# Patient Record
Sex: Female | Born: 1935 | Race: White | Hispanic: No | Smoking: Never smoker
Health system: Southern US, Community
[De-identification: ages and names within clinical notes are randomized; demographics above are authoritative.]

## PROBLEM LIST (undated history)

## (undated) DIAGNOSIS — E785 Hyperlipidemia, unspecified: Secondary | ICD-10-CM

## (undated) DIAGNOSIS — K5792 Diverticulitis of intestine, part unspecified, without perforation or abscess without bleeding: Secondary | ICD-10-CM

## (undated) DIAGNOSIS — M1990 Unspecified osteoarthritis, unspecified site: Secondary | ICD-10-CM

## (undated) DIAGNOSIS — N390 Urinary tract infection, site not specified: Secondary | ICD-10-CM

## (undated) DIAGNOSIS — R42 Dizziness and giddiness: Secondary | ICD-10-CM

## (undated) HISTORY — DX: Hyperlipidemia, unspecified: E78.5

## (undated) HISTORY — PX: ABDOMINAL HYSTERECTOMY: SHX81

## (undated) HISTORY — DX: Diverticulitis of intestine, part unspecified, without perforation or abscess without bleeding: K57.92

## (undated) HISTORY — DX: Unspecified osteoarthritis, unspecified site: M19.90

---
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
## 2012-03-16 ENCOUNTER — Ambulatory Visit (INDEPENDENT_AMBULATORY_CARE_PROVIDER_SITE_OTHER): Payer: Medicare Other | Admitting: Family Medicine

## 2012-03-16 ENCOUNTER — Encounter: Payer: Self-pay | Admitting: Family Medicine

## 2012-03-16 VITALS — BP 118/72 | HR 62 | Temp 97.5°F | Ht 64.0 in | Wt 161.8 lb

## 2012-03-16 DIAGNOSIS — E785 Hyperlipidemia, unspecified: Secondary | ICD-10-CM

## 2012-03-16 DIAGNOSIS — I10 Essential (primary) hypertension: Secondary | ICD-10-CM

## 2012-03-16 DIAGNOSIS — M1990 Unspecified osteoarthritis, unspecified site: Secondary | ICD-10-CM

## 2012-03-16 DIAGNOSIS — L89321 Pressure ulcer of left buttock, stage 1: Secondary | ICD-10-CM

## 2012-03-16 DIAGNOSIS — K219 Gastro-esophageal reflux disease without esophagitis: Secondary | ICD-10-CM | POA: Insufficient documentation

## 2012-03-16 DIAGNOSIS — M109 Gout, unspecified: Secondary | ICD-10-CM | POA: Insufficient documentation

## 2012-03-16 DIAGNOSIS — E079 Disorder of thyroid, unspecified: Secondary | ICD-10-CM

## 2012-03-16 LAB — LIPID PANEL
HDL: 50.2 mg/dL (ref >39.00)
Triglycerides: 200 mg/dL — ABNORMAL HIGH (ref 0.0–149.0)
VLDL: 40 mg/dL (ref 0.0–40.0)

## 2012-03-16 LAB — BASIC METABOLIC PANEL
BUN: 28 mg/dL — ABNORMAL HIGH (ref 6–23)
Chloride: 104 mEq/L (ref 96–112)
Glucose, Bld: 88 mg/dL (ref 70–99)
Potassium: 4.2 mEq/L (ref 3.5–5.1)
Sodium: 141 mEq/L (ref 135–145)

## 2012-03-16 LAB — HEPATIC FUNCTION PANEL
Bilirubin, Direct: 0.1 mg/dL (ref 0.0–0.3)
Total Bilirubin: 0.3 mg/dL (ref 0.3–1.2)

## 2012-03-16 LAB — TSH: TSH: 0.47 u[IU]/mL (ref 0.35–5.50)

## 2012-03-16 MED ORDER — ALLOPURINOL 100 MG PO TABS: 100.0000 mg | ORAL_TABLET | Freq: Every day | ORAL | Status: AC

## 2012-03-16 MED ORDER — CEPHALEXIN 500 MG PO CAPS: 500.0000 mg | ORAL_CAPSULE | Freq: Two times a day (BID) | ORAL | Status: AC

## 2012-03-16 MED ORDER — MICONAZOLE NITRATE 2 % EX CREA: TOPICAL_CREAM | Freq: Two times a day (BID) | CUTANEOUS | Status: AC

## 2012-03-16 MED ORDER — TRAMADOL HCL 50 MG PO TABS: 50.0000 mg | ORAL_TABLET | Freq: Three times a day (TID) | ORAL | Status: AC | PRN

## 2012-03-16 NOTE — Patient Instructions (Addendum)
Follow up in 6 months We'll notify you of your lab results and make any changes if needed Apply the Miconazole cream twice daily to buttock Start the Keflex twice daily for skin infection Start the Tramadol as needed for pain Someone will call you about home health evaluation Call with any questions or concerns Welcome and Happy Early [place] Ouch!

## 2012-03-16 NOTE — Progress Notes (Signed)
  Subjective:    Patient ID: Olivia Weaver, female    DOB: September 09, 1918, 78 y.o.   MRN: [id number]  HPI New to establish.  Recently moved in w/ daughter.  HTN- chronic problem, on Monopril and Toprol.  No CP, SOB, visual changes, edema.  + HAs.  Hyperlipidemia- chronic problem, has been maintained on Crestor samples.  Last blood work was ~6 months ago.  Not exercising due to mobility issues.  Gout- chronic problem, on Allopurinol daily, last flare was 'years ago'.  Needs refill on meds.  GERD- chronic problem, on Prilosec OTC daily  Osteoarthritis- bilateral knees, ambulates w/ walker.  Pt has pain daily  Compression fx- per daughter's report.  L buttock sore- started 1 week ago, appeared blister like.  Daughter treated w/ neosporin and reports 'it went away'.  Now w/ redness below the skin and the redness is 'travelling'.  Thyroid mass- doctor in [facility] offered tx/bx, pt refused.   Review of Systems For ROS see HPI     Objective:   Physical Exam  Vitals reviewed. Constitutional: She is oriented to person, place, and time. She appears well-developed and well-nourished. No distress.       Frail, elderly lady in wheel chair  HENT:  Head: Normocephalic and atraumatic.  Eyes: Conjunctivae and EOM are normal. Pupils are equal, round, and reactive to light.  Neck: Normal range of motion. Neck supple. No thyromegaly present.  Cardiovascular: Normal rate, regular rhythm, normal heart sounds and intact distal pulses.   No murmur heard. Pulmonary/Chest: Effort normal and breath sounds normal. No respiratory distress.  Abdominal: Soft. She exhibits no distension. There is no tenderness.  Musculoskeletal: She exhibits no edema.  Lymphadenopathy:    She has no cervical adenopathy.  Neurological: She is alert and oriented to person, place, and time.  Skin: Skin is warm and dry. There is erythema (small circular area of erythema over L ischium- blanches easily).  Psychiatric: She has a normal  mood and affect. Her behavior is normal.          Assessment & Plan:

## 2012-03-17 NOTE — Assessment & Plan Note (Signed)
Chronic problem.  Well controlled.  Asymptomatic.  No changes. 

## 2012-03-17 NOTE — Assessment & Plan Note (Signed)
New to provider, chronic for pt.  Severely limiting ambulation.  Handicapped form completed for pt's daughter.  Will start Ultram for pain relief.

## 2012-03-17 NOTE — Assessment & Plan Note (Signed)
New to provider.  Chronic for pt.  Maintaining on samples.  Check labs.  Adjust meds prn.

## 2012-03-17 NOTE — Assessment & Plan Note (Signed)
New to provider.  Chronic for pt.  Has not had recent flare.  Hesitant to d/c allopurinol for fear of precipitating a flare.  Will continue at this time.

## 2012-03-17 NOTE — Assessment & Plan Note (Signed)
New.  Appears to be pressure related.  Start fungal cream as pt wears depends and this will provide barrier.  Start Keflex for possible cellulitis.  Reviewed w/ daughter proper positioning techniques, including changing position every 2-4 hrs.  Will follow.

## 2012-03-17 NOTE — Assessment & Plan Note (Signed)
New to provider, chronic problem for pt.  sxs well controlled on omeprazole.  No changes.

## 2012-03-20 ENCOUNTER — Other Ambulatory Visit: Payer: Self-pay | Admitting: *Deleted

## 2012-03-20 NOTE — Telephone Encounter (Signed)
Called pt and spoke with daughter per incoming notice from [facility] stating they are unable to fill her recent rx request per they do not have information on this pt, daughter advised that she gave our office the wrong pharmacy information however this has been corrected and the medication is currently on the way to her home at this time pt daughter Gene Schroll apologized for telling us the wrong pharmacy information, she also asked about the home health referral for this pt, advised that I will fax this information to [facility] and that someone will call her about coming out to her home to assess, pt daughter understood, pt information and completed form to [facility]

## 2012-03-27 ENCOUNTER — Telehealth: Payer: Self-pay | Admitting: *Deleted

## 2012-03-27 ENCOUNTER — Emergency Department (HOSPITAL_BASED_OUTPATIENT_CLINIC_OR_DEPARTMENT_OTHER)
Admission: EM | Admit: 2012-03-27 | Discharge: 2012-03-27 | Disposition: A | Discharge status: Home / Self Care | Payer: Medicare Other | Attending: Emergency Medicine | Admitting: Emergency Medicine

## 2012-03-27 ENCOUNTER — Encounter (HOSPITAL_BASED_OUTPATIENT_CLINIC_OR_DEPARTMENT_OTHER): Payer: Self-pay | Admitting: Emergency Medicine

## 2012-03-27 ENCOUNTER — Emergency Department (INDEPENDENT_AMBULATORY_CARE_PROVIDER_SITE_OTHER): Payer: Medicare Other

## 2012-03-27 DIAGNOSIS — E860 Dehydration: Secondary | ICD-10-CM

## 2012-03-27 DIAGNOSIS — R51 Headache: Secondary | ICD-10-CM

## 2012-03-27 DIAGNOSIS — R4182 Altered mental status, unspecified: Secondary | ICD-10-CM | POA: Insufficient documentation

## 2012-03-27 DIAGNOSIS — E785 Hyperlipidemia, unspecified: Secondary | ICD-10-CM | POA: Insufficient documentation

## 2012-03-27 DIAGNOSIS — Z742 Need for assistance at home and no other household member able to render care: Secondary | ICD-10-CM

## 2012-03-27 HISTORY — DX: Urinary tract infection, site not specified: N39.0

## 2012-03-27 HISTORY — DX: Dizziness and giddiness: R42

## 2012-03-27 LAB — DIFFERENTIAL
Basophils Absolute: 0 10*3/uL (ref 0.0–0.1)
Basophils Relative: 0 % (ref 0–1)
Eosinophils Absolute: 0.1 10*3/uL (ref 0.0–0.7)
Eosinophils Relative: 1 % (ref 0–5)
Lymphocytes Relative: 25 % (ref 12–46)
Lymphs Abs: 1.9 10*3/uL (ref 0.7–4.0)
Monocytes Absolute: 0.7 K/uL (ref 0.1–1.0)
Monocytes Relative: 9 % (ref 3–12)
Neutro Abs: 4.7 K/uL (ref 1.7–7.7)
Neutrophils Relative %: 64 % (ref 43–77)

## 2012-03-27 LAB — CBC
HCT: 35.7 % — ABNORMAL LOW (ref 36.0–46.0)
Hemoglobin: 12.1 g/dL (ref 12.0–15.0)
MCH: 33 pg (ref 26.0–34.0)
MCHC: 33.9 g/dL (ref 30.0–36.0)
MCV: 97.3 fL (ref 78.0–100.0)
Platelets: 148 10*3/uL — ABNORMAL LOW (ref 150–400)
RBC: 3.67 MIL/uL — ABNORMAL LOW (ref 3.87–5.11)
RDW: 12.9 % (ref 11.5–15.5)
WBC: 7.3 10*3/uL (ref 4.0–10.5)

## 2012-03-27 LAB — URINALYSIS, ROUTINE W REFLEX MICROSCOPIC
Bilirubin Urine: NEGATIVE
Glucose, UA: NEGATIVE mg/dL
Hgb urine dipstick: NEGATIVE
Ketones, ur: NEGATIVE mg/dL
Leukocytes, UA: NEGATIVE
Nitrite: NEGATIVE
Protein, ur: NEGATIVE mg/dL
Specific Gravity, Urine: 1.012 (ref 1.005–1.030)
Urobilinogen, UA: 0.2 mg/dL (ref 0.0–1.0)
pH: 6.5 (ref 5.0–8.0)

## 2012-03-27 LAB — BASIC METABOLIC PANEL WITH GFR
BUN: 42 mg/dL — ABNORMAL HIGH (ref 6–23)
CO2: 28 meq/L (ref 19–32)
Calcium: 10 mg/dL (ref 8.4–10.5)
Chloride: 103 meq/L (ref 96–112)
Creatinine, Ser: 1.3 mg/dL — ABNORMAL HIGH (ref 0.50–1.10)
GFR calc Af Amer: 40 mL/min — ABNORMAL LOW
GFR calc non Af Amer: 35 mL/min — ABNORMAL LOW
Glucose, Bld: 94 mg/dL (ref 70–99)
Potassium: 5 meq/L (ref 3.5–5.1)
Sodium: 139 meq/L (ref 135–145)

## 2012-03-27 LAB — SEDIMENTATION RATE: Sed Rate: 59 mm/hr — ABNORMAL HIGH (ref 0–22)

## 2012-03-27 MED ORDER — SODIUM CHLORIDE 0.9 % IV BOLUS (SEPSIS)
500.0000 mL | Freq: Once | INTRAVENOUS | Status: AC
  Administered 2012-03-27: 500 mL via INTRAVENOUS

## 2012-03-27 MED ORDER — DIPHENHYDRAMINE HCL 50 MG/ML IJ SOLN
12.5000 mg | Freq: Once | INTRAMUSCULAR | Status: AC
  Administered 2012-03-27: 50 mg via INTRAVENOUS
  Filled 2012-03-27: qty 1

## 2012-03-27 MED ORDER — METOCLOPRAMIDE HCL 5 MG/ML IJ SOLN
5.0000 mg | Freq: Once | INTRAMUSCULAR | Status: AC
  Administered 2012-03-27: 10 mg via INTRAVENOUS
  Filled 2012-03-27: qty 2

## 2012-03-27 MED ORDER — SODIUM CHLORIDE 0.9 % IV BOLUS (SEPSIS)
500.0000 mL | Freq: Once | INTRAVENOUS | Status: AC
  Administered 2012-03-27: 1000 mL via INTRAVENOUS

## 2012-03-27 MED ORDER — FENTANYL CITRATE 0.05 MG/ML IJ SOLN
12.5000 ug | Freq: Once | INTRAMUSCULAR | Status: AC
  Administered 2012-03-27: 10:00:00 via INTRAVENOUS
  Filled 2012-03-27: qty 2

## 2012-03-27 NOTE — ED Provider Notes (Addendum)
History     CSN: [id number]  Arrival date & time 03/27/12  7829   First MD Initiated Contact with Patient 03/27/12 [phone]      Chief Complaint  Patient presents with  . Headache  . Altered Mental Status    (Consider location/radiation/quality/duration/timing/severity/associated sxs/prior treatment) HPI Comments: Pt was in usual state of health last night, lives with daughter, developed gradual frontal HA last night, that waxed and waned.  Not worse with movement, wasn't able to get comfortable last night, got up early this AM, told family about it around 0800 and wanted to come to the ED.  She seemed mildly more confused than usual, family thought possibly UTI which she has had confusion in the past with same.  Pt denies N/V, no fevers, chills, ate dinner well last night, no slurred speech, balance or spinning sensation is ok now.  She reports HA pain is improved on its own.  She gets N/V often with even OTC meds and has ultram at home, but pt never takes it.  No numbness or weakness in limbs.  Pt denies visual changes or loss.    The history is provided by the patient and a relative.    Past Medical History  Diagnosis Date  . Arthritis   . Diverticulitis   . Hyperlipidemia   . Gout   . Vertigo   . Urinary tract infection     Past Surgical History  Procedure Date  . Appendectomy 1933  . Abdominal hysterectomy     Family History  Problem Relation Age of Onset  . Arthritis Mother     History  Substance Use Topics  . Smoking status: Never Smoker   . Smokeless tobacco: Not on file  . Alcohol Use: Yes     shot of segrams and a beer every night    OB History    Grav Para Term Preterm Abortions TAB SAB Ect Mult Living                  Review of Systems  Constitutional: Negative for fever and chills.  HENT: Negative for congestion, sore throat, rhinorrhea, sneezing, neck pain and neck stiffness.   Eyes: Negative for visual disturbance.  Respiratory: Negative for  cough and shortness of breath.   Cardiovascular: Negative for chest pain.  Gastrointestinal: Negative for nausea and vomiting.  Genitourinary: Negative for dysuria.  Musculoskeletal: Positive for back pain and arthralgias.  Skin: Negative for color change and rash.  Neurological: Positive for headaches. Negative for dizziness, syncope, speech difficulty and weakness.  Psychiatric/Behavioral: Positive for confusion.    Allergies  Tylenol  Home Medications   Current Outpatient Rx  Name Route Sig Dispense Refill  . ALLOPURINOL 100 MG PO TABS Oral Take 1 tablet (100 mg total) by mouth daily. 90 tablet 3  . ALLOPURINOL 100 MG PO TABS Oral Take 1 tablet (100 mg total) by mouth daily. 90 tablet 3  . ASPIRIN 81 MG PO TABS Oral Take 81 mg by mouth daily.    . CEPHALEXIN 500 MG PO CAPS Oral Take 1 capsule (500 mg total) by mouth 2 (two) times daily. 20 capsule 0  . VITAMIN D 1000 UNITS PO TABS Oral Take 2,000 Units by mouth daily.    . FOSINOPRIL SODIUM 40 MG PO TABS Oral Take 40 mg by mouth daily.    Marland Kitchen METOPROLOL SUCCINATE ER 50 MG PO TB24 Oral Take 50 mg by mouth daily. Take with or immediately following a meal.    .  MICONAZOLE NITRATE 2 % EX CREA Topical Apply topically 2 (two) times daily. 30 g 1  . OMEPRAZOLE MAGNESIUM 20 MG PO TBEC Oral Take 20 mg by mouth daily.    Marland Kitchen ROSUVASTATIN CALCIUM 5 MG PO TABS Oral Take 5 mg by mouth daily.    . TRAMADOL HCL 50 MG PO TABS Oral Take 1 tablet (50 mg total) by mouth every 8 (eight) hours as needed for pain. 45 tablet 1    BP 127/68  Pulse 79  Temp(Src) 98.1 F (36.7 C) (Oral)  Resp 16  Ht 5\' 3"  (1.6 m)  Wt 161 lb (73.029 kg)  BMI 28.52 kg/m2  SpO2 100%  Physical Exam  Nursing note and vitals reviewed. Constitutional: She appears well-developed and well-nourished.  HENT:  Head: Normocephalic and atraumatic.       No tenderness at temporal regions  Eyes: Conjunctivae and EOM are normal. Pupils are equal, round, and reactive to light.  Right eye exhibits no chemosis. Left eye exhibits no chemosis. Right conjunctiva is not injected. Left conjunctiva is not injected.  Neck: Normal range of motion. Neck supple.  Cardiovascular: Normal rate.   Pulmonary/Chest: Effort normal. No respiratory distress.  Abdominal: Soft. She exhibits no distension. There is no tenderness.  Neurological: She is alert. GCS eye subscore is 4. GCS verbal subscore is 4. GCS motor subscore is 6.  Skin: Skin is warm and intact. No rash noted. She is not diaphoretic. No pallor.    ED Course  Procedures (including critical care time)  Labs Reviewed  CBC - Abnormal; Notable for the following:    RBC 3.67 (*)    HCT 35.7 (*)    Platelets 148 (*)    All other components within normal limits  BASIC METABOLIC PANEL - Abnormal; Notable for the following:    BUN 42 (*)    Creatinine, Ser 1.30 (*)    GFR calc non Af Amer 35 (*)    GFR calc Af Amer 40 (*)    All other components within normal limits  SEDIMENTATION RATE - Abnormal; Notable for the following:    Sed Rate 59 (*)    All other components within normal limits  URINALYSIS, ROUTINE W REFLEX MICROSCOPIC  DIFFERENTIAL   Ct Head Wo Contrast  03/27/2012  *RADIOLOGY REPORT*  Clinical Data: Headache  CT HEAD WITHOUT CONTRAST  Technique:  Contiguous axial images were obtained from the base of the skull through the vertex without contrast.  Comparison: None.  Findings: Chronic ischemic changes.  Atrophy.  No mass effect, midline shift, or acute intracranial hemorrhage.  Mastoid air cells and visualized paranasal sinuses are clear.  Cranium is intact.  IMPRESSION: No acute intracranial pathology.  Original Report Authenticated By: Donavan Burnet, M.D.     1. Headache   2. Dehydration     11:06 AM WBC and Hgb are near normal.  Head CT per radiologist reports no acute. I reviewed the images myself.  Pt is feeling a little improved.  BMP results shows elevated BUN and slightly to Cr, suggesting mild  dehydration.  IVF bolus given.    12:37 PM Pt feels improved, IVF's given.  Sed rate is mildly elevated, for her age a sed rate of 40 is likely upper limit of normal, but I doubt between the mild elevation and location of symptoms with no temporal tenderness, no visual change, normal eye exam makes TA unlikely.     MDM  Pt is not toxic appearing, no fever, no  gross focal neuro deficits here, no facial droop, slurred speech m,moves 4 extremities with purpose symmetrically.  No pronator drift noted.  Given age, will get sed rate, check basic labs and head CT.  Since HA is improved, doubt bleed or infectious etiology.  No fever.  No meningismus.  Will try reglan and benadryl and low dose fentanyl for pain.          Gavin Pound. Oletta Lamas, MD 03/27/12 1238  Gavin Pound. Skylier Kretschmer, MD 03/27/12 1239

## 2012-03-27 NOTE — Telephone Encounter (Signed)
Spoke to pt daughter and advised that I will call the Home Health agency to see what the update is, pt daughter understood and wanted to let MD Beverely Low be aware that the pt did well with hydrating for 2 days and then ended up in the [facility] today per dehydration and was given IV fluids with more instructions to start hydrate again, pt is feeling better now, I advised that I will also alert MD Tabori about pt recent developments, this nurse re-faxed pt home care app and information needed.

## 2012-03-27 NOTE — Telephone Encounter (Signed)
Pt daughter calling to check the status of the home health referral that was suppose to be coming out to the home.Please advise

## 2012-03-27 NOTE — ED Notes (Signed)
Daughter states her mother woke her up to c/o headache that had been going on all night.  Daughter states she is having some episodes of confusion and not making sense.  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  Difficult in communicating with patient.

## 2012-03-27 NOTE — Discharge Instructions (Signed)
Dehydration, Elderly  Dehydration is when you lose more fluids from the body than you take in. Vital organs such as the kidneys, brain, and heart cannot function without a proper amount of fluids and salt. Any loss of fluids from the body can cause dehydration.   Older adults are at a higher risk of dehydration than younger adults. As we age, our bodies are less able to conserve water and do not respond to temperature changes as well. Also, older adults do not become thirsty as easily or quickly. Because of this, older adults often do not realize they need to increase fluids to avoid dehydration.   CAUSES    Vomiting.   Diarrhea.   Excessive sweating.   Excessive urine output.   Fever.  SYMPTOMS   Mild dehydration   Thirst.   Dry lips.   Slightly dry mouth.  Moderate dehydration   Very dry mouth.   Sunken eyes.   Skin does not bounce back quickly when lightly pinched and released.   Dark urine and decreased urine production.   Decreased tear production.   Headache.  Severe dehydration   Very dry mouth.   Extreme thirst.   Rapid, weak pulse (more than 100 beats per minute at rest).   Cold hands and feet.   Not able to sweat in spite of heat.   Rapid breathing.   Blue lips.   Confusion and lethargy.   Difficulty being awakened.   Minimal urine production.   No tears.  DIAGNOSIS   Your caregiver will diagnose dehydration based on your symptoms and your exam. Blood and urine tests will help confirm the diagnosis. The diagnostic evaluation should also identify the cause of dehydration.  TREATMENT   Treatment of mild or moderate dehydration can often be done at home by increasing the amount of fluids that you drink. It is best to drink small amounts of fluid more often. Drinking too much at one time can make vomiting worse.   Severe dehydration needs to be treated at the hospital where you will probably be given intravenous (IV) fluids that contain water and electrolytes.  HOME CARE INSTRUCTIONS     Ask your caregiver about specific rehydration instructions.   Drink enough fluids to keep your urine clear or pale yellow.   Drink small amounts frequently if you have nausea and vomiting.   Eat as you normally do.   Avoid:   Foods or drinks high in sugar.   Carbonated drinks.   Juice.   Extremely hot or cold fluids.   Drinks with caffeine.   Fatty, greasy foods.   Alcohol.   Tobacco.   Overeating.   Gelatin desserts.   Wash your hands well to avoid spreading bacteria and viruses.   Only take over-the-counter or prescription medicines for pain, discomfort, or fever as directed by your caregiver.   Ask your caregiver if you should continue all prescribed and over-the-counter medicines.   Keep all follow-up appointments with your caregiver.  SEEK MEDICAL CARE IF:   You have abdominal pain and it increases or stays in one area (localizes).   You have a rash, stiff neck, or severe headache.   You are irritable, sleepy, or difficult to awaken.   You are weak, dizzy, or extremely thirsty.  SEEK IMMEDIATE MEDICAL CARE IF:    You are unable to keep fluids down, or you get worse despite treatment.   You have frequent episodes of vomiting or diarrhea.   You have blood or green   matter (bile) in your vomit.   You have blood in your stool or your stool looks black and tarry.   You have not urinated in 6 to 8 hours, or you have only urinated a small amount of very dark urine.   You have a fever.   You faint.  MAKE SURE YOU:    Understand these instructions.   Will watch your condition.   Will get help right away if you are not doing well or get worse.  Document Released: 01/25/2004 Document Revised: 10/24/2011 Document Reviewed: 06/24/2011  Exit[employer] Patient Information 2012 [employer].

## 2012-03-27 NOTE — ED Notes (Signed)
MD at bedside. 

## 2012-03-29 NOTE — Telephone Encounter (Signed)
Noted- pt needs to continue oral hydration and needs home health referral.  May need to discuss hospice care if pt unable to stay hydrated (just for consultation)

## 2012-03-30 NOTE — Telephone Encounter (Signed)
Pt daughter advised that [facility] came out to review yesterday and advised they will

## 2012-03-30 NOTE — Telephone Encounter (Signed)
Pt family member noted that she is pushing the hydration concern to the pt, also placed the referral for home health in the chart, MD Tabori made aware verbally

## 2012-03-30 NOTE — Telephone Encounter (Signed)
Spoke to pt daughter and she advised that [facility] rep stephanie came out yesterday and advised pt the importance of fluid intake, pt understood and noted intake increase today of a cup of coffee, cup of orange juice and 2 six ounce cups of tea, Judeth Cornfield advised that the needs for the pt are in the works and that MD Beverely Low will be advised about pt needs asap.she also advised daughter about hospice assessment per fluid intake, pt daughter advised that she wants to wait for a week to see if she is going to continue improving with her intake, this nurse advised if she has any questions or concerns to please call the office and if she notes and sxs worsening per lack of fluid intake to please take the pt to the ED as she did recently, pt daughter advised that she will do that especially since the [address] ED was so wonderful to her mother.

## 2012-03-30 NOTE — Telephone Encounter (Signed)
Called pt daughter to advise instructions about hospice if the pt still refusing to hydrate, left message on machine to call office to discuss

## 2012-04-07 ENCOUNTER — Telehealth: Payer: Self-pay | Admitting: *Deleted

## 2012-04-07 NOTE — Telephone Encounter (Signed)
Call transfer from [facility]: [facility] from [facility] called stating that Pt had a episode of emesis that looked like bile on last night. Nurse notes that Pt has not vomited since but does have daily episodes of vomiting. Nurse denied any other symptoms. Pt is also c/o a lot of gaging which initiate the vomiting. Per Nurse the Prilosec used to help with these symptoms but has since not been successful in resolving issue. Physical therapy consult is also being requested. Per Dr Beverely Low Pt needs to be seen in office but if Pt has another episode of bile vomit Pt needs to be seen in ED immediately. Nurse and Pt daughter both OK info, appt scheduled for tomorrow and verbal Ok given for therapy.

## 2012-04-08 ENCOUNTER — Ambulatory Visit (HOSPITAL_BASED_OUTPATIENT_CLINIC_OR_DEPARTMENT_OTHER)
Admission: RE | Admit: 2012-04-08 | Discharge: 2012-04-08 | Disposition: A | Discharge status: Home / Self Care | Payer: Medicare Other | Source: Ambulatory Visit | Attending: Family Medicine | Admitting: Family Medicine

## 2012-04-08 ENCOUNTER — Ambulatory Visit (INDEPENDENT_AMBULATORY_CARE_PROVIDER_SITE_OTHER): Payer: Medicare Other | Admitting: Family Medicine

## 2012-04-08 VITALS — BP 121/77 | HR 86 | Temp 98.3°F | Ht 64.0 in | Wt 160.0 lb

## 2012-04-08 DIAGNOSIS — R1312 Dysphagia, oropharyngeal phase: Secondary | ICD-10-CM

## 2012-04-08 DIAGNOSIS — K219 Gastro-esophageal reflux disease without esophagitis: Secondary | ICD-10-CM

## 2012-04-08 DIAGNOSIS — R799 Abnormal finding of blood chemistry, unspecified: Secondary | ICD-10-CM

## 2012-04-08 DIAGNOSIS — D649 Anemia, unspecified: Secondary | ICD-10-CM

## 2012-04-08 DIAGNOSIS — R7989 Other specified abnormal findings of blood chemistry: Secondary | ICD-10-CM

## 2012-04-08 DIAGNOSIS — R109 Unspecified abdominal pain: Secondary | ICD-10-CM | POA: Insufficient documentation

## 2012-04-08 DIAGNOSIS — R1114 Bilious vomiting: Secondary | ICD-10-CM | POA: Insufficient documentation

## 2012-04-08 MED ORDER — OMEPRAZOLE 40 MG PO CPDR: 40.0000 mg | DELAYED_RELEASE_CAPSULE | Freq: Every day | ORAL | Status: AC

## 2012-04-08 NOTE — Assessment & Plan Note (Signed)
Deteriorated.  Daughter reports increased gas and belching w/ soda and beer.  Increase PPI to 40mg .  May need to see GI if sxs persist.  Reviewed supportive care and red flags that should prompt return.  Daughter expressed understanding and is in agreement.

## 2012-04-08 NOTE — Progress Notes (Signed)
  Subjective:    Patient ID: Olivia Weaver, female    DOB: 06-24-1918, 78 y.o.   MRN: [id number]  HPI Vomiting- went to ER on 5/10 for dehydration and 'we've been going down hill since then' per daughter's report.  Pt is 'wretching and up comes the brown stuff'.  Sunday night vomited 'yellow, green, brown bile'.  Is coughing to the point of vomiting.  Daughter reports pt is 'paranoid' about BMs and frequently complains of lower abdominal pain.  Ate breakfast and lunch today w/out difficulty but yesterday had multiple episodes of wretching/dry heaves.  Constantly nauseated.  No fevers.  Last BM yesterday.  Daughter reports the coughing/wretching occurs w/ both eating and drinking but rarely w/out prompting.  No fevers.  Pt reports feeling well today.   Review of Systems For ROS see HPI     Objective:   Physical Exam  Vitals reviewed. Constitutional: She appears well-developed and well-nourished. No distress.  HENT:  Head: Normocephalic and atraumatic.       Hard of hearing  Neck: Normal range of motion.  Cardiovascular: Normal rate and regular rhythm.   Murmur (II/VI SEM) heard. Pulmonary/Chest: Effort normal and breath sounds normal. No respiratory distress. She has no wheezes. She has no rales.  Abdominal: Soft. She exhibits no distension. There is no tenderness. There is no rebound and no guarding.       Hyperactive BS  Musculoskeletal: She exhibits edema (trace-1+ edema bilaterally). She exhibits no tenderness.  Lymphadenopathy:    She has no cervical adenopathy.  Skin: Skin is warm and dry.          Assessment & Plan:

## 2012-04-08 NOTE — Assessment & Plan Note (Signed)
New.  Last occurred yesterday.  Pt able to eat today w/ difficulty.  No diarrhea.  Having normal BMs per daughter's report.  No pain on PE.  Discussed possible bowel obstruction.  Check labs to r/o infxn, metabolic abnormality.  Get 2 view CXR to r/o obstruction or other obvious abnormality.  May need to proceed w/ CT.  Encouraged pt's daughter to take her to the ER if pt's sxs return.  Will follow.

## 2012-04-08 NOTE — Assessment & Plan Note (Signed)
New.  Due to reported coughing/retching w/ food or drink suspect pt may be aspirating.  Called [facility] to arrange a swallow eval.  Pt's daughter is aware and in agreement.

## 2012-04-08 NOTE — Telephone Encounter (Signed)
Pt was seen and treated by MD Tabori today 

## 2012-04-08 NOTE — Telephone Encounter (Signed)
Noted.  Will tx at [facility]

## 2012-04-08 NOTE — Patient Instructions (Signed)
We'll notify you of your lab results Please [facility] and get your Xrays done at [facility] will come and do a swallowing evaluation If you again have vomiting or persistent retching please go to the ER Call with any questions or concerns Hang in there!

## 2012-04-09 LAB — CBC WITH DIFFERENTIAL/PLATELET
Basophils Relative: 0.4 % (ref 0.0–3.0)
Eosinophils Relative: 1.2 % (ref 0.0–5.0)
Lymphocytes Relative: 29.7 % (ref 12.0–46.0)
Monocytes Relative: 6.7 % (ref 3.0–12.0)
Neutrophils Relative %: 62 % (ref 43.0–77.0)
Platelets: 181 10*3/uL (ref 150.0–400.0)
RBC: 3.38 Mil/uL — ABNORMAL LOW (ref 3.87–5.11)
WBC: 7.1 10*3/uL (ref 4.5–10.5)

## 2012-04-09 LAB — BASIC METABOLIC PANEL
CO2: 26 mEq/L (ref 19–32)
Chloride: 105 mEq/L (ref 96–112)
GFR: 34.52 mL/min — ABNORMAL LOW (ref >60.00)
Glucose, Bld: 99 mg/dL (ref 70–99)
Potassium: 5.1 mEq/L (ref 3.5–5.1)
Sodium: 141 mEq/L (ref 135–145)

## 2012-04-09 LAB — HEPATIC FUNCTION PANEL
ALT: 13 U/L (ref 0–35)
AST: 21 U/L (ref 0–37)
Albumin: 3.3 g/dL — ABNORMAL LOW (ref 3.5–5.2)
Alkaline Phosphatase: 89 U/L (ref 39–117)
Total Protein: 6.7 g/dL (ref 6.0–8.3)

## 2012-04-09 NOTE — Progress Notes (Signed)
Addended by: Derry Lory A on: 04/09/2012 05:18 PM   Modules accepted: Orders

## 2012-04-16 ENCOUNTER — Telehealth: Payer: Self-pay | Admitting: *Deleted

## 2012-04-16 DIAGNOSIS — Z0279 Encounter for issue of other medical certificate: Secondary | ICD-10-CM

## 2012-04-16 DIAGNOSIS — M545 Low back pain: Secondary | ICD-10-CM

## 2012-04-16 DIAGNOSIS — R269 Unspecified abnormalities of gait and mobility: Secondary | ICD-10-CM

## 2012-04-16 DIAGNOSIS — IMO0002 Reserved for concepts with insufficient information to code with codable children: Secondary | ICD-10-CM

## 2012-04-16 DIAGNOSIS — E860 Dehydration: Secondary | ICD-10-CM

## 2012-04-16 NOTE — Telephone Encounter (Signed)
Olivia [facility] for [facility] went out today to evaluation  Pt on swallowing. A  Bed side swallow was perform which indicated that  Pt difficulty seem to be related to her Esophagus. Pt has refused any further testing like modified barring swallow, barring swallow or x-ray. So without any further testing there is not very much they can do. Hopefully to the increase in reflux med will help Pt with her issue.

## 2012-04-16 NOTE — Telephone Encounter (Signed)
Noted.  If pt is having difficulty w/ esophageal phase of swallowing will need GI referral.  Please ask family if they are interested.

## 2012-04-17 NOTE — Telephone Encounter (Signed)
Discuss with patient daughter who states that they will discuss GI issue and give [place] a call back if they would like to pursue matter.

## 2012-04-17 NOTE — Telephone Encounter (Signed)
.  left message to have patient return my call with pt husband 

## 2012-04-17 NOTE — Telephone Encounter (Signed)
Received vm from Loletha Grayer [phone] requesting pt home health aid and PT/OT extension per dehydration concerns, etc to be extended, called vm and gave verbal order to extend per MD Tabori, left detailed message with office number and extension

## 2012-04-24 ENCOUNTER — Ambulatory Visit (INDEPENDENT_AMBULATORY_CARE_PROVIDER_SITE_OTHER): Payer: Medicare Other

## 2012-04-24 ENCOUNTER — Encounter: Payer: Self-pay | Admitting: *Deleted

## 2012-04-24 DIAGNOSIS — E876 Hypokalemia: Secondary | ICD-10-CM

## 2012-04-24 DIAGNOSIS — D649 Anemia, unspecified: Secondary | ICD-10-CM

## 2012-04-24 DIAGNOSIS — R7989 Other specified abnormal findings of blood chemistry: Secondary | ICD-10-CM

## 2012-04-24 DIAGNOSIS — R799 Abnormal finding of blood chemistry, unspecified: Secondary | ICD-10-CM

## 2012-04-24 LAB — BASIC METABOLIC PANEL
CO2: 29 mEq/L (ref 19–32)
Calcium: 9.4 mg/dL (ref 8.4–10.5)
GFR: 42.21 mL/min — ABNORMAL LOW (ref >60.00)
Sodium: 139 mEq/L (ref 135–145)

## 2012-04-24 LAB — CBC WITH DIFFERENTIAL/PLATELET
Basophils Absolute: 0 10*3/uL (ref 0.0–0.1)
HCT: 33.1 % — ABNORMAL LOW (ref 36.0–46.0)
Lymphs Abs: 1.3 10*3/uL (ref 0.7–4.0)
Monocytes Absolute: 0.5 10*3/uL (ref 0.1–1.0)
Monocytes Relative: 5.9 % (ref 3.0–12.0)
Neutrophils Relative %: 77.4 % — ABNORMAL HIGH (ref 43.0–77.0)
Platelets: 186 10*3/uL (ref 150.0–400.0)
RDW: 13.6 % (ref 11.5–14.6)

## 2012-04-24 NOTE — Telephone Encounter (Signed)
Pt daughter called in for lab results and advised that her mother is refusing to go to GI, pt daughter expressed frustration by noting she mentioned assisted living to her mother per still refusing to drink and notes her sister used to take care of mother and had a nervous breakdown doing it, advised that I will let MD Beverely Low know about all updates, pt daughter understood

## 2012-04-26 NOTE — Telephone Encounter (Signed)
It is very difficult to care for an elderly parent- particularly when they don't do what we would think is appropriate or want them to do.  This is extremely stressful for the caregivers and ends up being a bad environment for everyone- including the pt.  At this time, it is appropriate for Hospice to get involved.

## 2012-04-27 ENCOUNTER — Telehealth: Payer: Self-pay | Admitting: *Deleted

## 2012-04-27 NOTE — Telephone Encounter (Signed)
Pt daughter left vm to advise that the pt is no longer living with her and now lives in [place], if MD Beverely Low has any questions we can call her home number, advised MD Tabori verbally about message, will call later to discuss

## 2012-04-28 NOTE — Telephone Encounter (Signed)
.  left message to have patient return my call.  

## 2012-04-28 NOTE — Telephone Encounter (Signed)
Called pt daughter to receive update per MD Beverely Low had advised hospice for pt, however pt was moved prior to contact to advise, left vm to advise to call office if she feels the need to discuss any needs further.

## 2012-05-01 ENCOUNTER — Telehealth: Payer: Self-pay | Admitting: *Deleted

## 2012-05-01 NOTE — Telephone Encounter (Signed)
Received call from [place] with [facility] stating pt's daughter has notified them that pt has moved in with her son in [place]. [facility] will be cancelling their services.

## 2012-05-01 NOTE — Telephone Encounter (Signed)
Noted Thanks you

## 2012-09-16 ENCOUNTER — Ambulatory Visit: Payer: Medicare Other | Admitting: Family Medicine

## 2013-02-16 DEATH — deceased

## 2013-06-08 IMAGING — CT CT HEAD W/O CM
1 series · 16 of 30 positions shown, 20 images · non-contrast
Comparison: None.

CLINICAL DATA: Headache

CT HEAD WITHOUT CONTRAST
TECHNIQUE: Contiguous axial images were obtained from the base of
the skull through the vertex without contrast.

[Series 2: head 4.8 h37s · axial · 0.43mm/px · z∈[-100,+56]mm · 16 of 36 slices shown, 20 images]
[im 2/36  brain]
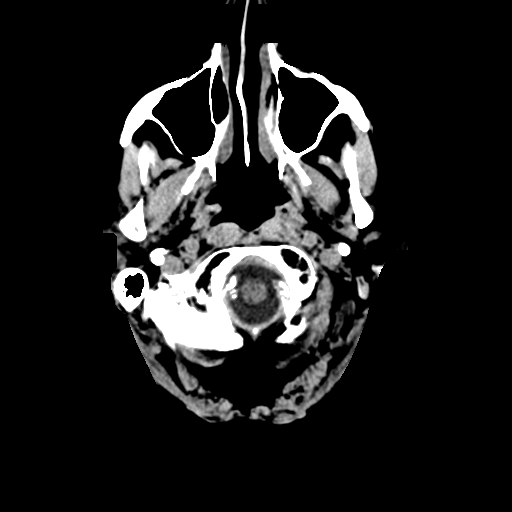
[im 2/36  bone]
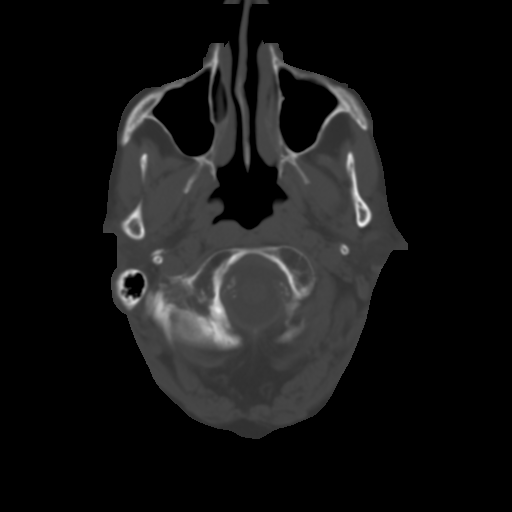
[im 4/36  brain]
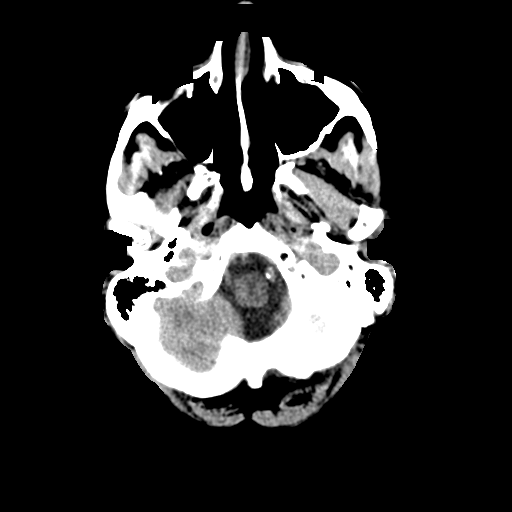
[im 7/36  brain]
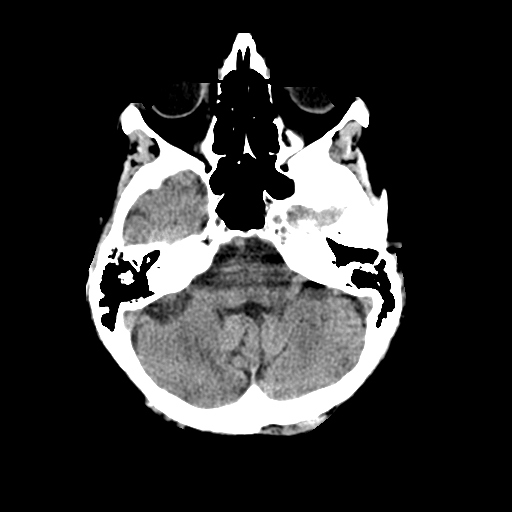
[im 9/36  brain]
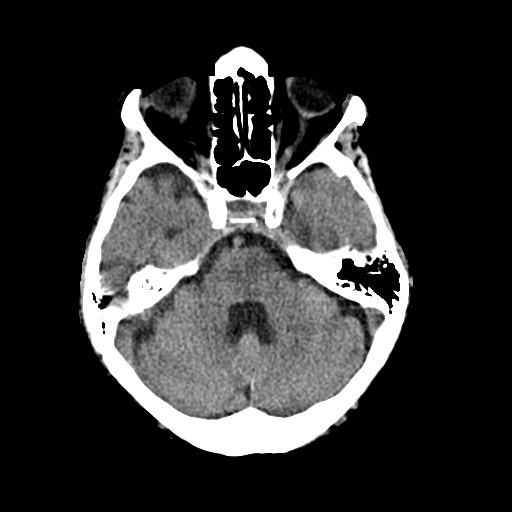
[im 10/36  brain]
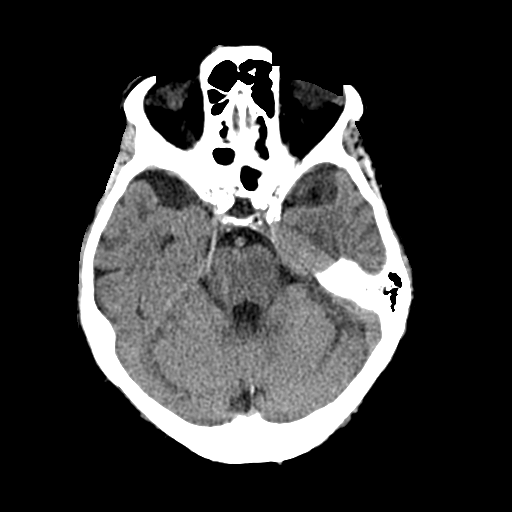
[im 10/36  bone]
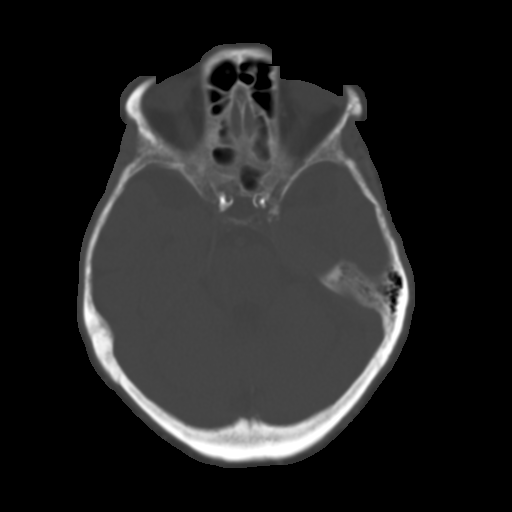
[im 13/36  brain]
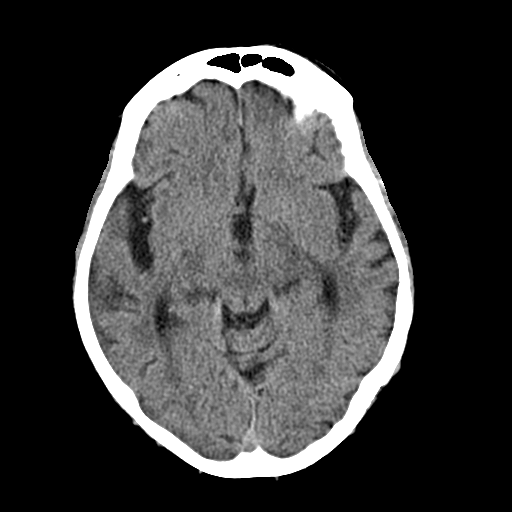
[im 15/36  brain]
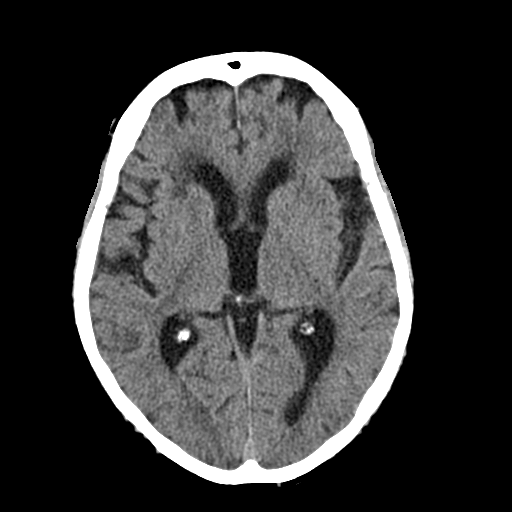
[im 17/36  brain]
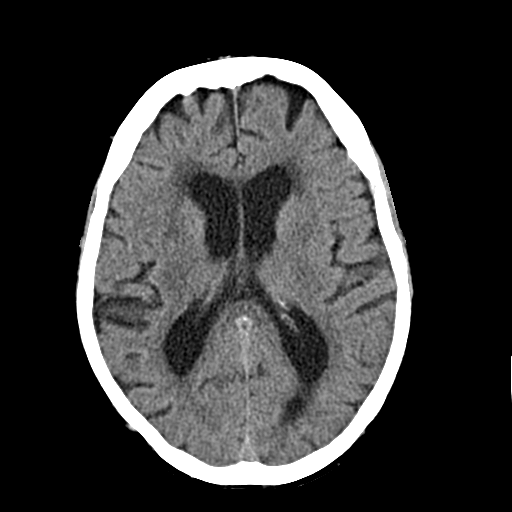
[im 19/36  brain]
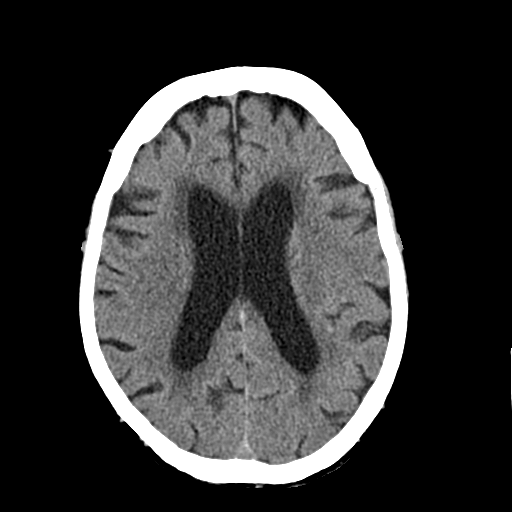
[im 19/36  bone]
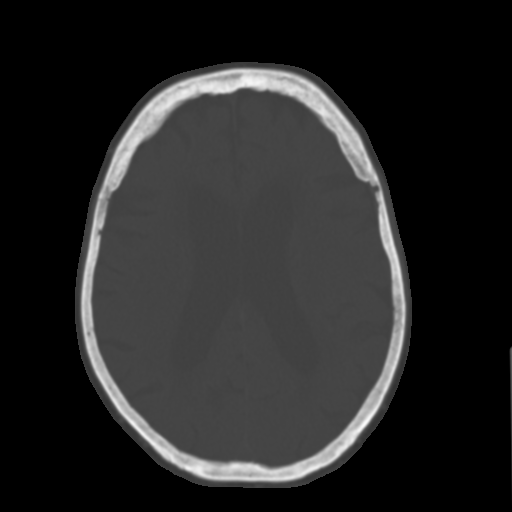
[im 21/36  brain]
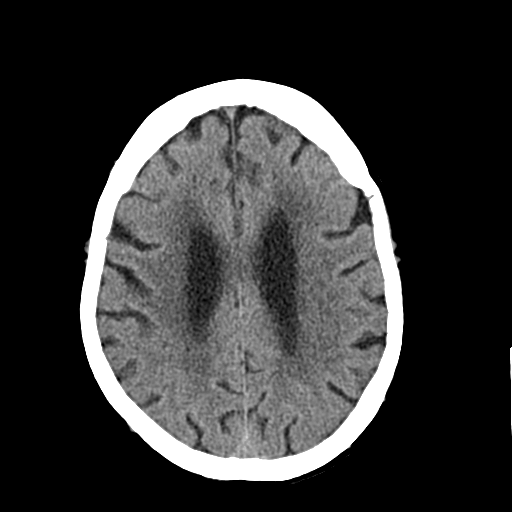
[im 23/36  brain]
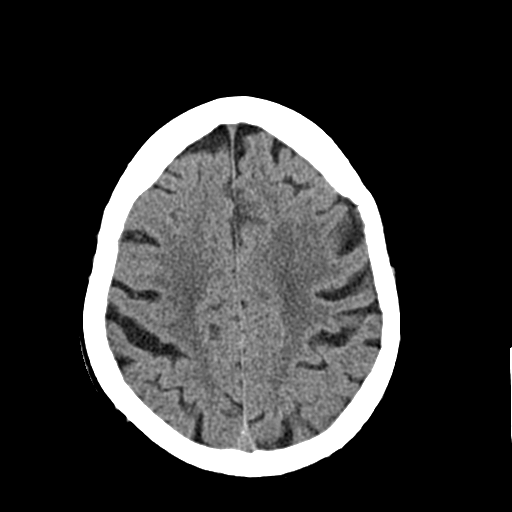
[im 26/36  brain]
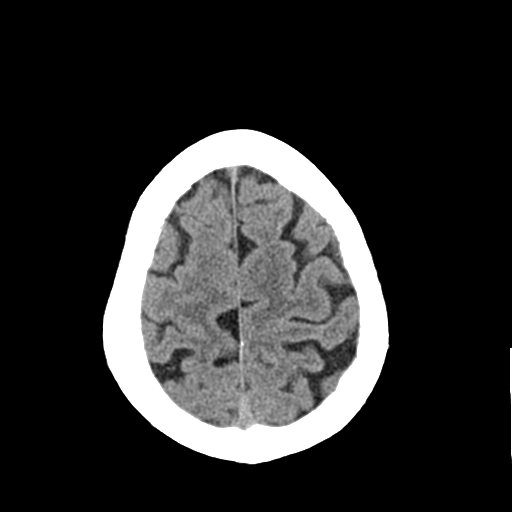
[im 27/36  brain]
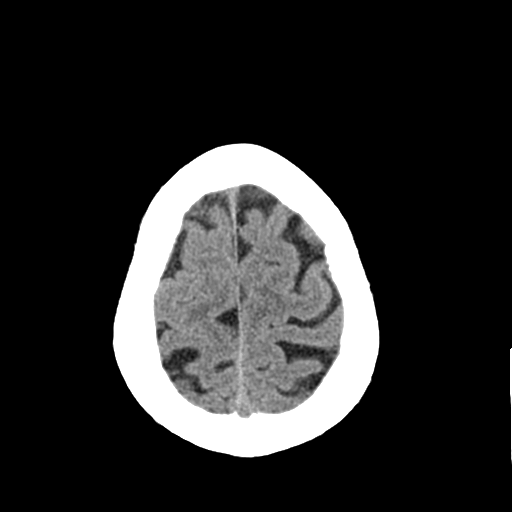
[im 27/36  bone]
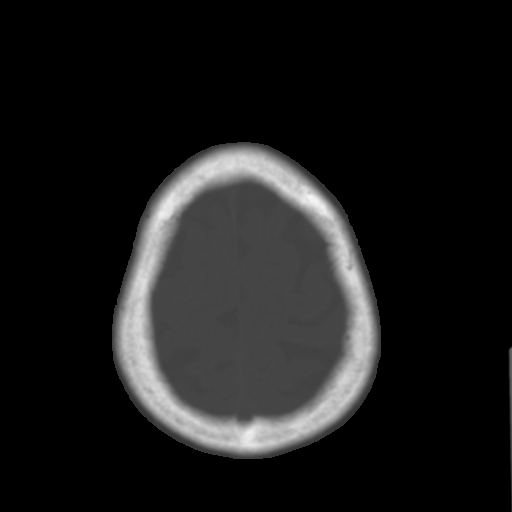
[im 29/36  brain]
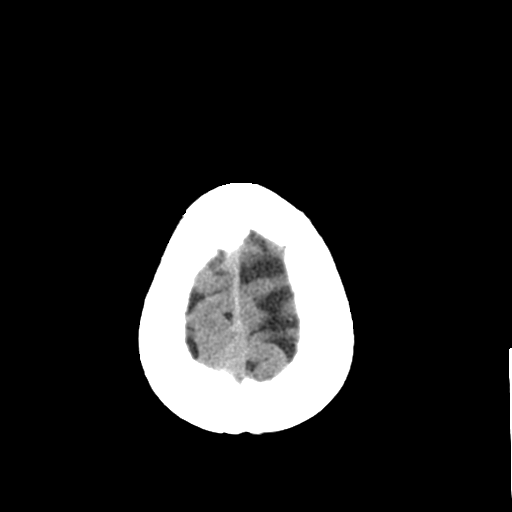
[im 32/36  brain]
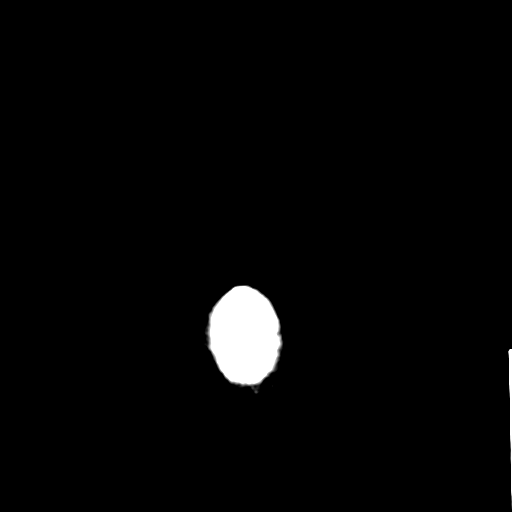
[im 34/36  brain]
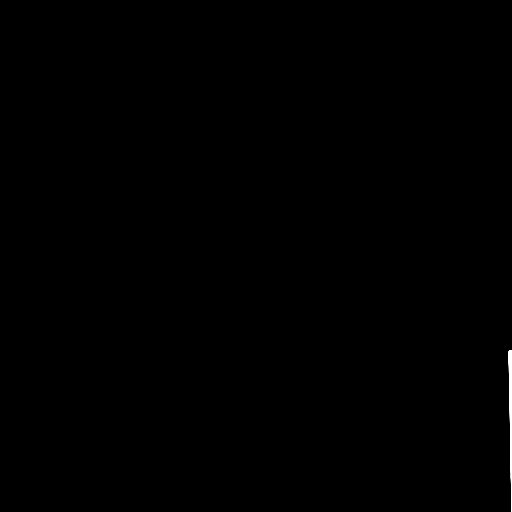

[16 of 30 positions shown; findings below may reference images not displayed]

FINDINGS: Chronic ischemic changes.  Atrophy.  No mass effect,
midline shift, or acute intracranial hemorrhage.  Mastoid air cells
and visualized paranasal sinuses are clear.  Cranium is intact.
IMPRESSION: No acute intracranial pathology.

## 2015-08-08 ENCOUNTER — Telehealth: Payer: Self-pay | Admitting: Family Medicine

## 2015-08-08 NOTE — Telephone Encounter (Signed)
Caller name: Schrull,Jean Relation to WU:[id number]  Call back number: [phone]   Reason for call:  Daughter received letter in mail regarding PCP relocating, daughter wanted to inform office mother passed away.

## 2015-08-08 NOTE — Telephone Encounter (Signed)
FYI
